# Patient Record
Sex: Male | Born: 1985 | Race: Black or African American | Hispanic: No | Marital: Single | Smoking: Never smoker
Health system: Southern US, Community
[De-identification: ages and names within clinical notes are randomized; demographics above are authoritative.]

---

## 2005-12-20 ENCOUNTER — Emergency Department (HOSPITAL_COMMUNITY): Admission: EM | Admit: 2005-12-20 | Discharge: 2005-12-20 | Payer: Self-pay | Admitting: Emergency Medicine

## 2009-07-03 ENCOUNTER — Emergency Department (HOSPITAL_COMMUNITY): Admission: EM | Admit: 2009-07-03 | Discharge: 2009-07-03 | Payer: Self-pay | Admitting: Emergency Medicine

## 2009-08-04 ENCOUNTER — Emergency Department (HOSPITAL_COMMUNITY): Admission: EM | Admit: 2009-08-04 | Discharge: 2009-08-04 | Payer: Self-pay | Admitting: Emergency Medicine

## 2009-10-06 ENCOUNTER — Emergency Department (HOSPITAL_COMMUNITY): Admission: EM | Admit: 2009-10-06 | Discharge: 2009-10-06 | Payer: Self-pay | Admitting: Emergency Medicine

## 2010-10-02 ENCOUNTER — Emergency Department (HOSPITAL_COMMUNITY)
Admission: EM | Admit: 2010-10-02 | Discharge: 2010-10-02 | Discharge status: Against Medical Advice | Payer: Self-pay | Attending: Emergency Medicine | Admitting: Emergency Medicine

## 2010-10-02 DIAGNOSIS — R209 Unspecified disturbances of skin sensation: Secondary | ICD-10-CM | POA: Insufficient documentation

## 2010-10-02 DIAGNOSIS — R079 Chest pain, unspecified: Secondary | ICD-10-CM | POA: Insufficient documentation

## 2010-10-03 ENCOUNTER — Emergency Department (HOSPITAL_COMMUNITY)
Admission: EM | Admit: 2010-10-03 | Discharge: 2010-10-03 | Disposition: A | Discharge status: Home / Self Care | Payer: Self-pay | Attending: Emergency Medicine | Admitting: Emergency Medicine

## 2010-10-03 ENCOUNTER — Emergency Department (HOSPITAL_COMMUNITY): Payer: Self-pay

## 2010-10-03 DIAGNOSIS — M545 Low back pain, unspecified: Secondary | ICD-10-CM | POA: Insufficient documentation

## 2010-10-03 DIAGNOSIS — X58XXXA Exposure to other specified factors, initial encounter: Secondary | ICD-10-CM | POA: Insufficient documentation

## 2010-10-03 DIAGNOSIS — S335XXA Sprain of ligaments of lumbar spine, initial encounter: Secondary | ICD-10-CM | POA: Insufficient documentation

## 2010-10-03 DIAGNOSIS — M5430 Sciatica, unspecified side: Secondary | ICD-10-CM | POA: Insufficient documentation
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
## 2011-05-16 ENCOUNTER — Emergency Department (HOSPITAL_COMMUNITY): Payer: Self-pay

## 2011-05-16 ENCOUNTER — Encounter (HOSPITAL_COMMUNITY): Payer: Self-pay | Admitting: Emergency Medicine

## 2011-05-16 ENCOUNTER — Emergency Department (HOSPITAL_COMMUNITY)
Admission: EM | Admit: 2011-05-16 | Discharge: 2011-05-16 | Disposition: A | Discharge status: Home / Self Care | Payer: No Typology Code available for payment source | Attending: Emergency Medicine | Admitting: Emergency Medicine

## 2011-05-16 DIAGNOSIS — R079 Chest pain, unspecified: Secondary | ICD-10-CM | POA: Insufficient documentation

## 2011-05-16 DIAGNOSIS — M546 Pain in thoracic spine: Secondary | ICD-10-CM | POA: Insufficient documentation

## 2011-05-16 DIAGNOSIS — M25519 Pain in unspecified shoulder: Secondary | ICD-10-CM | POA: Insufficient documentation

## 2011-05-16 DIAGNOSIS — R209 Unspecified disturbances of skin sensation: Secondary | ICD-10-CM | POA: Insufficient documentation

## 2011-05-16 DIAGNOSIS — M25512 Pain in left shoulder: Secondary | ICD-10-CM

## 2011-05-16 MED ORDER — DIAZEPAM 5 MG PO TABS: 5.0000 mg | ORAL_TABLET | Freq: Three times a day (TID) | ORAL | Status: AC | PRN

## 2011-05-16 MED ORDER — HYDROCODONE-ACETAMINOPHEN 5-325 MG PO TABS: 1.0000 | ORAL_TABLET | Freq: Four times a day (QID) | ORAL | Status: AC | PRN

## 2011-05-16 MED ORDER — IBUPROFEN 800 MG PO TABS: 800.0000 mg | ORAL_TABLET | Freq: Three times a day (TID) | ORAL | Status: AC | PRN

## 2011-05-16 NOTE — Discharge Instructions (Signed)
Please use the medications as needed for pain.  Follow up with your primary care provider.  You may return to the ER at any time for worsening condition or any new symptoms that concern you    RESOURCE GUIDE  Dental Problems  Patients with Medicaid: [facility] [phone]                                           [id number] [employer] [address] Phone:  [phone]                                                  Phone:  [phone]  If unable to pay or uninsured, contact:  [facility] or [facility]. to become qualified for the adult dental clinic.  Chronic Pain Problems Contact [facility]  [phone] Patients need to be referred by their primary care doctor.  Insufficient Money for Medicine Contact [employer]:  call "211" or [facility] [phone].  No Primary Care Doctor Call [facility]  [phone] Other agencies that provide inexpensive medical care    [facility]  [phone]    [facility]  [phone]    [facility]  [phone]    [facility]  [phone]    [facility]  [phone]    [facility]  [phone]  Psychological Services [facility]  [phone] [facility] Services  [phone] [facility]   [phone] (emergency services [phone])  Substance Abuse Resources [facility]  ([phone] [phone] [facility] [phone] [facility] [place] [phone] Floydene Flock [phone] Residential & Outpatient Substance Abuse Program  [phone]  Abuse/Neglect [facility] Child Abuse Hotline (715)465-0462 [facility] Child Abuse Hotline (858)483-4795 (After Hours)  Emergency Shelter [facility] 201-477-6174  Maternity Homes [facility] [place] [facility] [phone] [phone] [facility] (732)[phone]  MRSA Hotline #:    (575)372-7733    [facility] Resources  [facility] [place]     [employer]                          [facility] Dept. [address] [place]                       [address]      [address]  [place]                                                Cristobal Goldmann Phone:  [phone]  Phone:  [phone]                 Phone:  [phone]  [place] [facility] Phone:  [phone]  [place] Child Abuse Hotline (336) 342-1394 [phone]) [phone] (After Hours)   
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
## 2011-05-17 NOTE — ED Provider Notes (Signed)
Medical screening examination/treatment/procedure(s) were performed by non-physician practitioner and as supervising physician I was immediately available for consultation/collaboration.  Kalonji Zurawski T Jemia Fata, MD 05/17/11 0718 

## 2011-09-16 ENCOUNTER — Emergency Department (HOSPITAL_COMMUNITY)
Admission: EM | Admit: 2011-09-16 | Discharge: 2011-09-17 | Disposition: A | Discharge status: Home / Self Care | Payer: No Typology Code available for payment source | Attending: Emergency Medicine | Admitting: Emergency Medicine

## 2011-09-16 ENCOUNTER — Encounter (HOSPITAL_COMMUNITY): Payer: Self-pay | Admitting: *Deleted

## 2011-09-16 ENCOUNTER — Emergency Department (HOSPITAL_COMMUNITY): Payer: No Typology Code available for payment source

## 2011-09-16 DIAGNOSIS — M25529 Pain in unspecified elbow: Secondary | ICD-10-CM | POA: Insufficient documentation

## 2011-09-16 DIAGNOSIS — S53409A Unspecified sprain of unspecified elbow, initial encounter: Secondary | ICD-10-CM

## 2011-09-16 DIAGNOSIS — S5000XA Contusion of unspecified elbow, initial encounter: Secondary | ICD-10-CM | POA: Insufficient documentation

## 2011-09-16 DIAGNOSIS — IMO0002 Reserved for concepts with insufficient information to code with codable children: Secondary | ICD-10-CM | POA: Insufficient documentation

## 2011-09-16 DIAGNOSIS — Y9367 Activity, basketball: Secondary | ICD-10-CM | POA: Insufficient documentation

## 2011-09-16 NOTE — ED Notes (Addendum)
Pt reports rt arm pain x2 days, pt was playing basketball yesterday and hit the lateral aspect of his rt elbow on the pole, pt w/ increased pain and contusion to area. CMS intact.

## 2011-09-17 MED ORDER — TRAMADOL HCL 50 MG PO TABS: 50.0000 mg | ORAL_TABLET | Freq: Four times a day (QID) | ORAL | Status: AC | PRN

## 2011-09-17 MED ORDER — IBUPROFEN 800 MG PO TABS: 800.0000 mg | ORAL_TABLET | Freq: Three times a day (TID) | ORAL | Status: AC

## 2011-09-17 NOTE — ED Provider Notes (Signed)
History     CSN: [id number]  Arrival date & time 09/16/11  4098   First MD Initiated Contact with Patient 09/17/11 0011      12:38 AM HPI Reports yesterday he accidentally hit his right elbow on a pole while playing basketball. States he woke up with a large contusion and severe pain. Reports he is unable to extend, flex, supinate, or pronate without severe pain of his right elbow. Denies numbness, tingling, weakness  Patient is a 26 y.o. male presenting with arm injury. The history is provided by the patient.  Arm Injury  The incident occurred yesterday. The injury mechanism was a direct blow. There is an injury to the right elbow. The pain is severe. It is unlikely that a foreign body is present. Pertinent negatives include no neck pain, no tingling and no weakness. There have been no prior injuries to these areas.    History reviewed. No pertinent past medical history.  History reviewed. No pertinent past surgical history.  History reviewed. No pertinent family history.  History  Substance Use Topics  . Smoking status: Never Smoker   . Smokeless tobacco: Not on file  . Alcohol Use: Yes     occasional      Review of Systems  HENT: Negative for neck pain.   Musculoskeletal:       Elbow pain and contusion.  Neurological: Negative for tingling and weakness.  All other systems reviewed and are negative.    Allergies  Review of patient's allergies indicates no known allergies.  Home Medications  No current outpatient prescriptions on file.  BP 111/81  Pulse 65  Temp 98.9 F (37.2 C) (Oral)  Resp 16  SpO2 100%  Physical Exam  Constitutional: He is oriented to person, place, and time. He appears well-developed and well-nourished.  HENT:  Head: Normocephalic and atraumatic.  Eyes: Pupils are equal, round, and reactive to light.  Musculoskeletal:       Right elbow: He exhibits decreased range of motion and swelling. He exhibits no effusion, no deformity and no  laceration. tenderness found. Medial epicondyle tenderness noted.       Arms:      Right elbow has a large contusion on the medial portion. Tender to palpation. Patient is able to flex the elbow approximately 90 but states in the morning and severe pain. Normal distal pulses, normal capillary refill. Normal sensation.  Neurological: He is alert and oriented to person, place, and time.  Skin: Skin is warm and dry. No rash noted. No erythema. No pallor.  Psychiatric: He has a normal mood and affect. His behavior is normal.    ED Course  Procedures   No results found for this or any previous visit. Dg Elbow Complete Right  09/16/2011  *RADIOLOGY REPORT*  Clinical Data: Hit right arm against basketball pole; bruising to the medial right elbow, and right elbow pain.  RIGHT ELBOW - COMPLETE 3+ VIEW  Comparison: None.  Findings: There is no evidence of fracture or dislocation.  The visualized joint spaces are preserved.  No significant joint effusion is identified.  The soft tissues are unremarkable in appearance.  IMPRESSION: No evidence of fracture or dislocation.  Original Report Authenticated By: Tonia Ghent, M.D.      MDM   Patient placed in Ace wrap for support. Advised ice elevation, warm compresses and gentle range of motion. Patient voices understanding and is ready for discharge.       Thomasene Lot, PA-C 09/17/11 0101

## 2011-09-17 NOTE — ED Notes (Signed)
Bed:WTR9<BR> Expected date:<BR> Expected time:<BR> Means of arrival:<BR> Comments:<BR> closed
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
## 2011-09-17 NOTE — ED Notes (Signed)
Bed:WTR6<BR> Expected date:<BR> Expected time:<BR> Means of arrival:<BR> Comments:<BR> closed

## 2011-09-17 NOTE — ED Provider Notes (Signed)
Medical screening examination/treatment/procedure(s) were performed by non-physician practitioner and as supervising physician I was immediately available for consultation/collaboration.   Hanley Seamen, MD 09/17/11 [phone]

## 2011-09-17 NOTE — Discharge Instructions (Signed)
  Elbow Contusion  Contusions are areas of tenderness and swelling in the soft tissues. They are the result of damage and bleeding in the injured area. Minor injuries will give you a painless bruise, but more severe contusions may stay painful and swollen for a few weeks. You have a deep bruise (contusion) of your elbow.  SYMPTOMS   A hematoma may form in large contusions. A hematoma is a collection of blood in the deep tissues. Hematomas are usually reabsorbed by the body naturally. Sometimes they need to be drained.   TREATMENT   Only a sling or splint may be needed for a brief period of time or as directed by your caregiver.   HOME CARE INSTRUCTIONS    Only take over-the-counter or prescription medicines for pain, discomfort, or fever as directed by your caregiver.   Rest the injured elbow until the pain and swelling are better.   Elevate the injury to reduce swelling.   Compression bandages also help reduce swelling and motion.   If you have a splint held on with an elastic wrap or a cast, watch your hand or fingers. If they become numb or cold and blue, loosen the wrap and reapply more loosely. See your caregiver if there is no relief.   You may use ice on your elbow for 20 minutes, 4 times per day, for the first 2 to 3 days.   Use your elbow as directed.   See your caregiver as directed. It is very important to keep all follow-up referrals and appointments in order to avoid any long-term problems with your elbow including chronic pain or inability to move the elbow normally.  SEEK IMMEDIATE MEDICAL CARE IF:    You show signs of infection (increased redness, swelling, or pain).   Swelling or increased pain in your elbow is not relieved with medications.   You have pain or inability to move your fingers or wrist.   You begin to lose feeling in your hand or fingers, or you develop swelling of the hand and fingers.   You get a cold or blue hand or fingers on the injured side.   If your elbow remains  sore, your caregiver may want to X-ray it again. A hairline fracture may not show up on the first X-rays and may only be seen on X-rays 10 days to 2 weeks later. A specialist (radiologist) may examine your X-rays at a later time. In order to get results from the X-rays, make sure you know how and when you are to get that information. It is your responsibility to get results of any tests you may have had.   Make sure you have a follow up exam with your caregiver.  MAKE SURE YOU:    Understand these instructions.   Will watch your condition.   Will get help right away if you are not doing well or get worse.  Document Released: 02/24/2006 Document Revised: 03/07/2011 Document Reviewed: 02/01/2011  ExitCare Patient Information 2012 [employer].

## 2014-07-21 ENCOUNTER — Encounter (HOSPITAL_COMMUNITY): Payer: Self-pay | Admitting: Emergency Medicine

## 2014-07-21 ENCOUNTER — Emergency Department (HOSPITAL_COMMUNITY)
Admission: EM | Admit: 2014-07-21 | Discharge: 2014-07-21 | Disposition: A | Discharge status: Home / Self Care | Payer: BLUE CROSS/BLUE SHIELD | Attending: Emergency Medicine | Admitting: Emergency Medicine

## 2014-07-21 DIAGNOSIS — M549 Dorsalgia, unspecified: Secondary | ICD-10-CM

## 2014-07-21 DIAGNOSIS — M545 Low back pain: Secondary | ICD-10-CM | POA: Insufficient documentation

## 2014-07-21 MED ORDER — DIAZEPAM 5 MG/ML IJ SOLN
5.0000 mg | Freq: Once | INTRAMUSCULAR | Status: AC
  Administered 2014-07-21: 5 mg via INTRAMUSCULAR
  Filled 2014-07-21: qty 2

## 2014-07-21 MED ORDER — TRAMADOL-ACETAMINOPHEN 37.5-325 MG PO TABS: 1.0000 | ORAL_TABLET | Freq: Four times a day (QID) | ORAL | Status: AC | PRN

## 2014-07-21 MED ORDER — DIAZEPAM 5 MG/ML IJ SOLN: 5.0000 mg | Freq: Once | INTRAMUSCULAR | Status: DC

## 2014-07-21 MED ORDER — METHOCARBAMOL 500 MG PO TABS: 500.0000 mg | ORAL_TABLET | Freq: Two times a day (BID) | ORAL | Status: AC

## 2014-07-21 MED ORDER — KETOROLAC TROMETHAMINE 60 MG/2ML IM SOLN
60.0000 mg | Freq: Once | INTRAMUSCULAR | Status: AC
  Administered 2014-07-21: 60 mg via INTRAMUSCULAR
  Filled 2014-07-21: qty 2
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
## 2014-07-21 NOTE — Discharge Instructions (Signed)
Take the prescribed medication as directed.  May also wish to apply heat to low back to help relieve muscle tension. Return to the ED for new or worsening symptoms.

## 2014-07-21 NOTE — ED Notes (Signed)
Pt c/o low back pain, mild x few weeks, last night he attempted to play basketball began having severe pain. Pt attempted to sleep unable d/t pain, describes at tightening. Pt was unable to stand d/t pain.
